# Patient Record
Sex: Female | Born: 1975 | Race: Black or African American | Hispanic: No | Marital: Married | Smoking: Never smoker
Health system: Southern US, Community
[De-identification: ages and names within clinical notes are randomized; demographics above are authoritative.]

## PROBLEM LIST (undated history)

## (undated) DIAGNOSIS — M7070 Other bursitis of hip, unspecified hip: Secondary | ICD-10-CM

## (undated) DIAGNOSIS — H3552 Pigmentary retinal dystrophy: Secondary | ICD-10-CM

## (undated) DIAGNOSIS — H548 Legal blindness, as defined in USA: Secondary | ICD-10-CM

---

## 2002-12-14 ENCOUNTER — Encounter: Payer: Self-pay | Admitting: Obstetrics and Gynecology

## 2002-12-14 ENCOUNTER — Ambulatory Visit (HOSPITAL_COMMUNITY): Admission: RE | Admit: 2002-12-14 | Discharge: 2002-12-14 | Payer: Self-pay | Admitting: Obstetrics and Gynecology

## 2003-01-19 ENCOUNTER — Encounter: Payer: Self-pay | Admitting: Emergency Medicine

## 2003-01-19 ENCOUNTER — Emergency Department (HOSPITAL_COMMUNITY): Admission: EM | Admit: 2003-01-19 | Discharge: 2003-01-20 | Payer: Self-pay

## 2003-04-29 ENCOUNTER — Inpatient Hospital Stay (HOSPITAL_COMMUNITY): Admission: AD | Admit: 2003-04-29 | Discharge: 2003-04-29 | Payer: Self-pay | Admitting: Obstetrics & Gynecology

## 2003-04-30 ENCOUNTER — Inpatient Hospital Stay (HOSPITAL_COMMUNITY): Admission: AD | Admit: 2003-04-30 | Discharge: 2003-05-02 | Payer: Self-pay | Admitting: Obstetrics & Gynecology

## 2003-06-01 ENCOUNTER — Other Ambulatory Visit: Admission: RE | Admit: 2003-06-01 | Discharge: 2003-06-01 | Payer: Self-pay | Admitting: Obstetrics & Gynecology

## 2003-06-17 ENCOUNTER — Ambulatory Visit (HOSPITAL_COMMUNITY): Admission: RE | Admit: 2003-06-17 | Discharge: 2003-06-17 | Payer: Self-pay | Admitting: Family Medicine

## 2006-05-03 ENCOUNTER — Ambulatory Visit (HOSPITAL_COMMUNITY): Admission: RE | Admit: 2006-05-03 | Discharge: 2006-05-03 | Payer: Self-pay | Admitting: Family Medicine

## 2006-05-03 ENCOUNTER — Emergency Department (HOSPITAL_COMMUNITY): Admission: EM | Admit: 2006-05-03 | Discharge: 2006-05-03 | Payer: Self-pay | Admitting: Emergency Medicine

## 2007-09-05 IMAGING — CT CT PELVIS W/ CM
2 of 5 series · 17 of 46 positions shown, 19 images · IV contrast ([ID]/WATER)
Comparison: none

CLINICAL DATA: Nausea and vomiting.  Abdominal and back pain. History of renal calculi.
ABDOMEN CT WITH CONTRAST:
TECHNIQUE: Multidetector CT imaging of the abdomen was performed following the standard protocol during bolus administration of intravenous contrast. 
Contrast:  100 cc Omnipaque 300 and oral contrast.
TECHNIQUE: Multidetector CT imaging of the pelvis was performed following the standard protocol during bolus administration of intravenous contrast.

[Series 2: routine abdomen · axial · 0.93mm/px · z∈[-435,-10]mm · 14 of 95 slices shown, 16 images]
[im 5/95  soft-tissue]
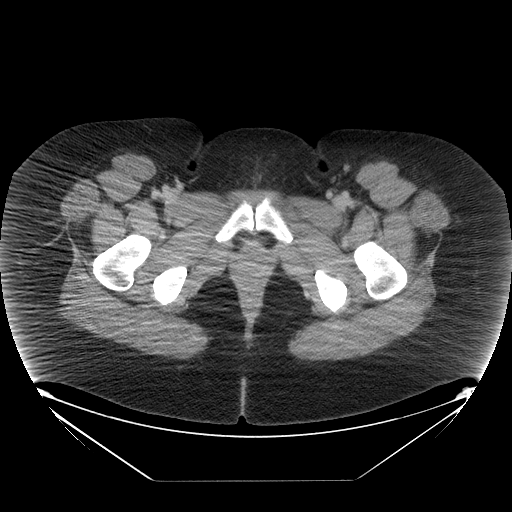
[im 5/95  bone]
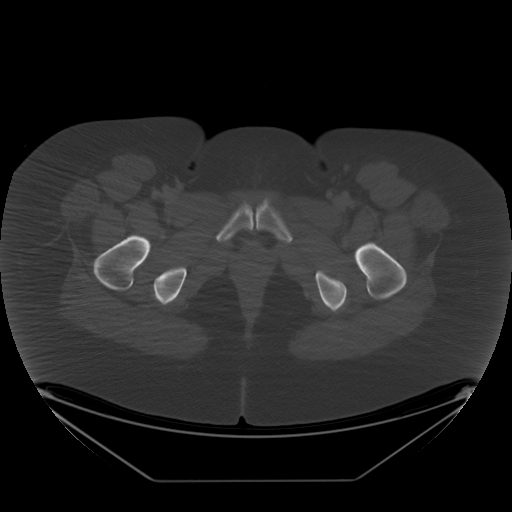
[im 15/95  soft-tissue]
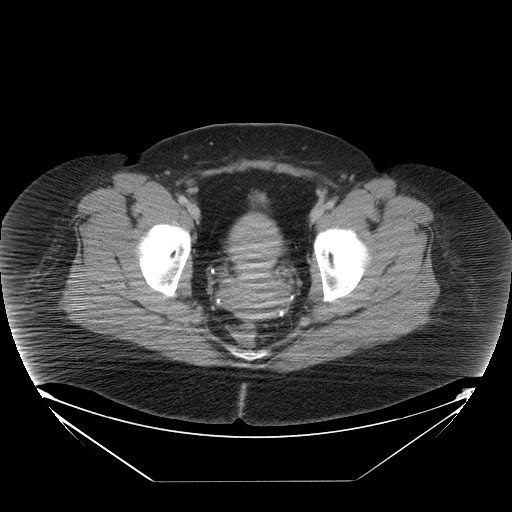
[im 19/95  soft-tissue]
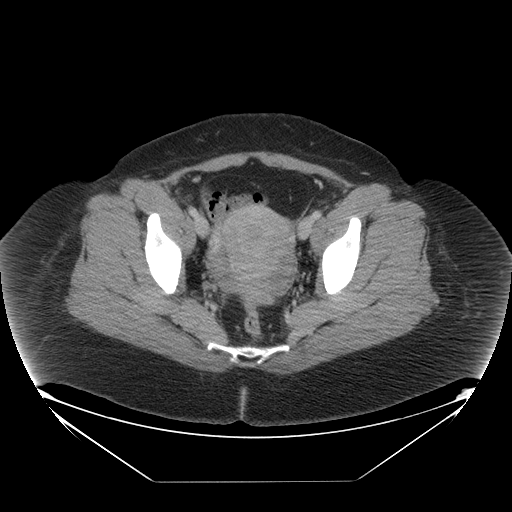
[im 24/95  soft-tissue]
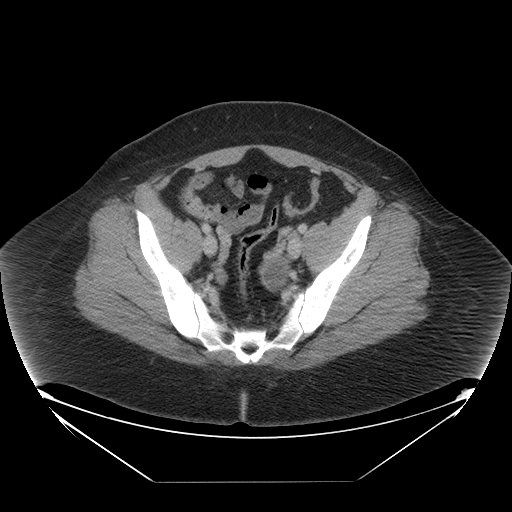
[im 33/95  soft-tissue]
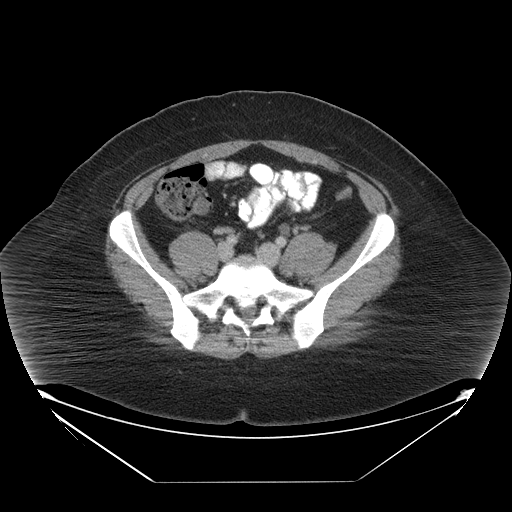
[im 38/95  soft-tissue]
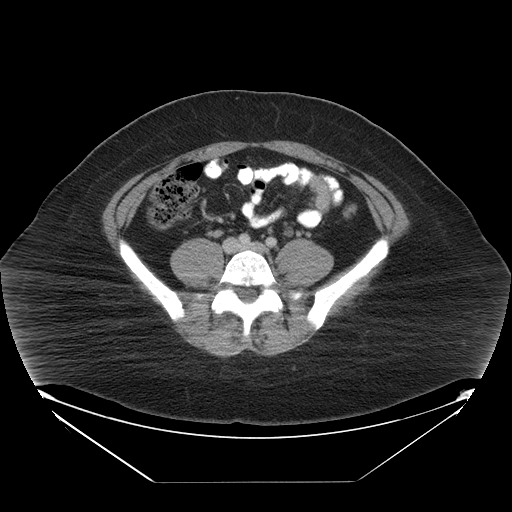
[im 43/95  soft-tissue]
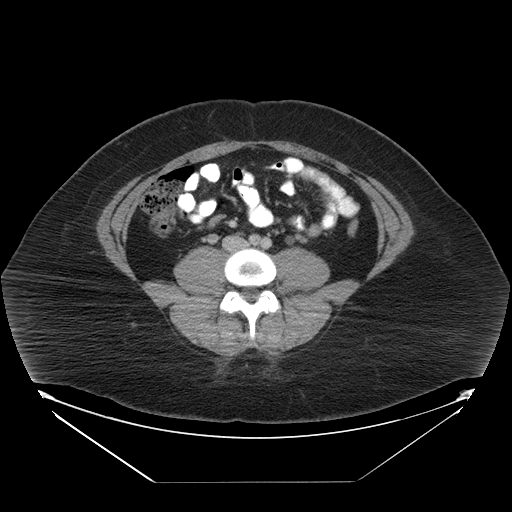
[im 52/95  soft-tissue]
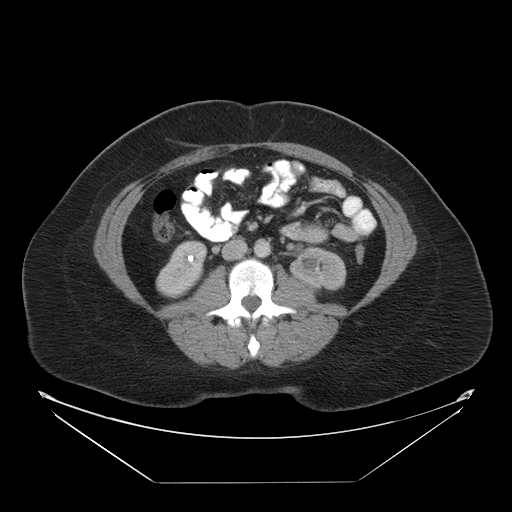
[im 57/95  soft-tissue]
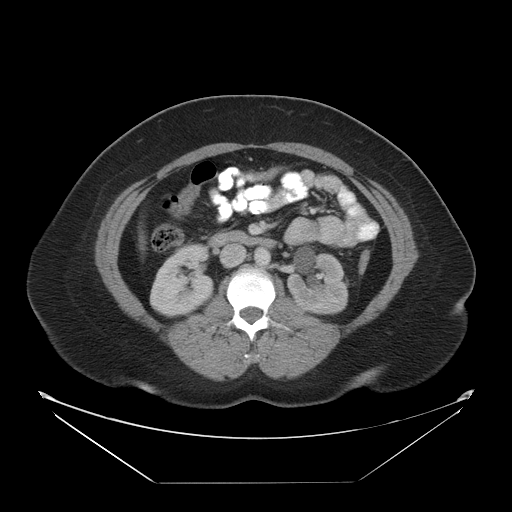
[im 57/95  bone]
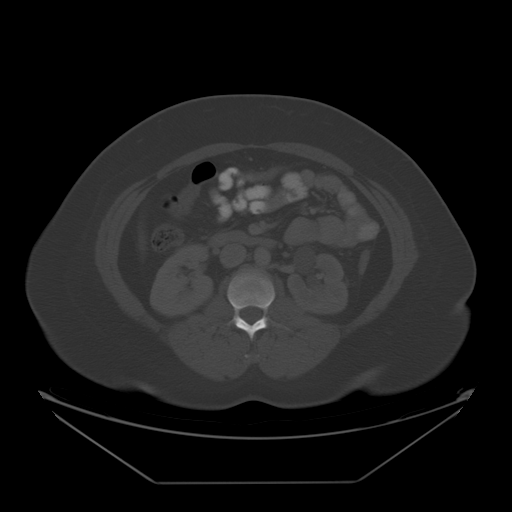
[im 62/95  soft-tissue]
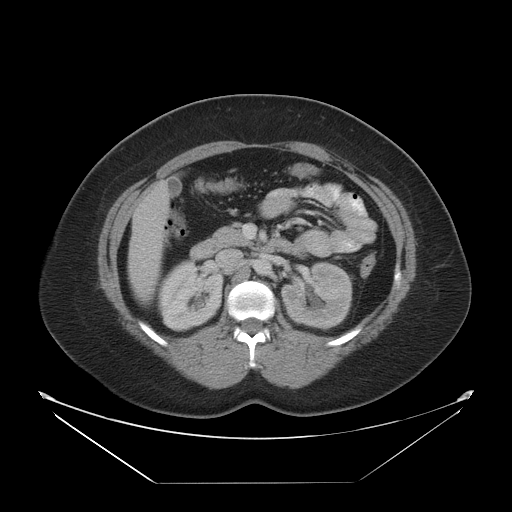
[im 71/95  soft-tissue]
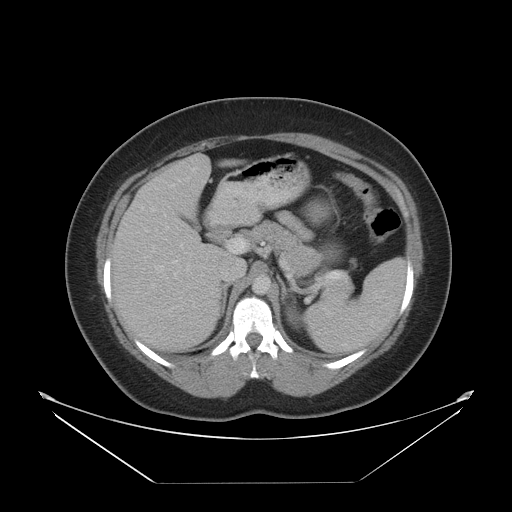
[im 76/95  soft-tissue]
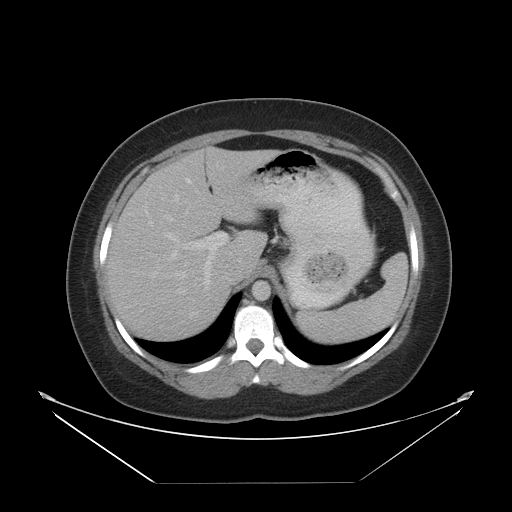
[im 80/95  soft-tissue]
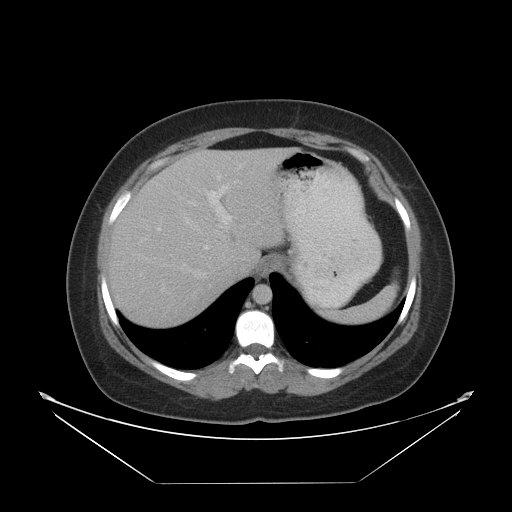
[im 90/95  soft-tissue]
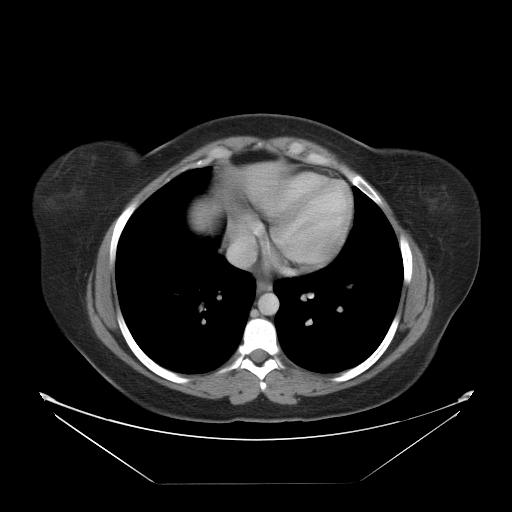

[Series 400: reformatted · coronal · 0.99mm/px · 3 of 143 slices shown]
[im 48/143  soft-tissue]
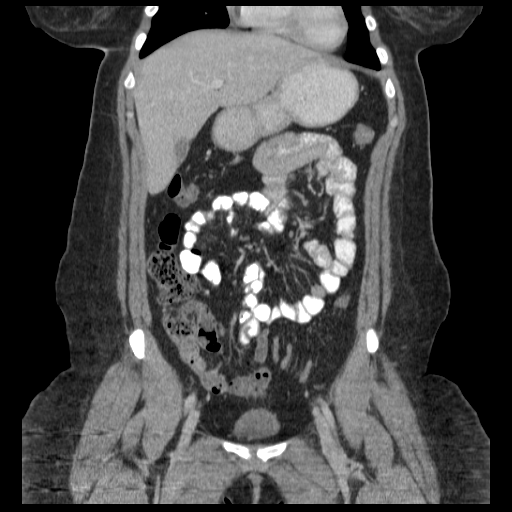
[im 64/143  soft-tissue]
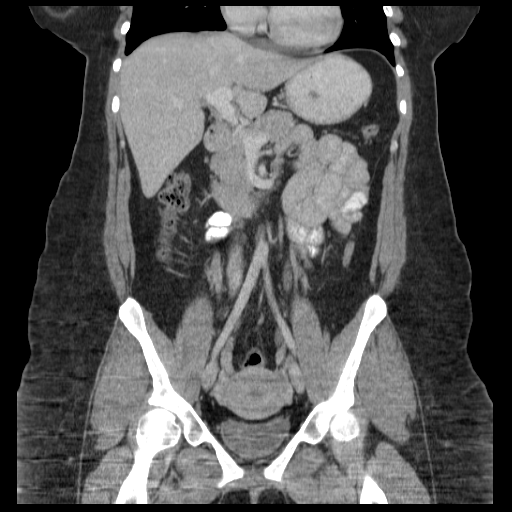
[im 79/143  soft-tissue]
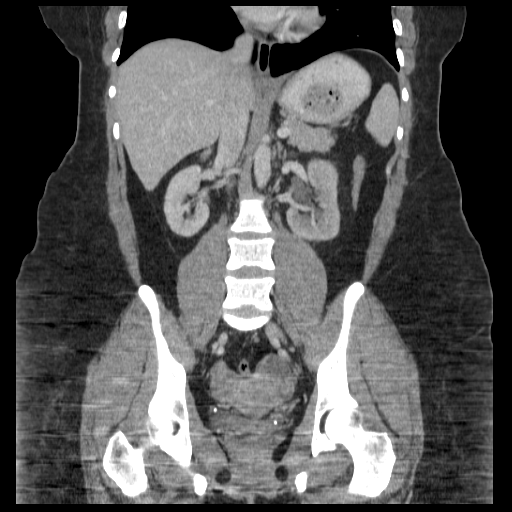

[17 of 46 positions shown; findings below may reference images not displayed]

FINDINGS: A calculus is seen in the lower pole of the right kidney which measures 5 mm. There is no evidence of right-sided hydronephrosis.  No left intrarenal calculi are seen however, there is mild left hydronephrosis and ureterectasis.  No renal masses are seen.
The liver, gallbladder, spleen, pancreas, and adrenal glands are normal in appearance.  There is no evidence of an abdominal mass or inflammatory process.  No abnormal fluid collections are seen.
IMPRESSION: 1.  Mild left hydronephrosis.  See pelvis CT report below.
2.  5 mm nonobstructing calculus in the lower pole of the right kidney.
PELVIS CT WITH CONTRAST:
FINDINGS: A calculus is seen at the left ureterovesical junction which measures approximately 3 mm.  Mild dilatation of the left ureter is seen.  
There is no evidence of a pelvic mass or adenopathy.  There is no evidence of an inflammatory process or abnormal fluid collections.  The other pelvic structures are unremarkable.
IMPRESSION: 3 mm calculus at the left ureterovesical junction.  This results in mild left ureteral dilatation and hydronephrosis.

## 2013-09-16 ENCOUNTER — Telehealth: Payer: Self-pay | Admitting: Emergency Medicine

## 2013-09-16 NOTE — Telephone Encounter (Signed)
Haley Bass REFERRED PT FOR POSSIBLE [place] CONSULT- [phone] X [phone]  FROM DR HELEN MALONE  Haley THEN CALLED BACK AND LMOVM TO CANCEL THE CONSULT THAT WAS SCHEDULED FOR 09-21-13 AT 100PM

## 2017-03-15 ENCOUNTER — Other Ambulatory Visit: Payer: Self-pay

## 2017-03-15 ENCOUNTER — Encounter (HOSPITAL_BASED_OUTPATIENT_CLINIC_OR_DEPARTMENT_OTHER): Payer: Self-pay | Admitting: Emergency Medicine

## 2017-03-15 ENCOUNTER — Emergency Department (HOSPITAL_BASED_OUTPATIENT_CLINIC_OR_DEPARTMENT_OTHER)
Admission: EM | Admit: 2017-03-15 | Discharge: 2017-03-15 | Disposition: A | Discharge status: Home / Self Care | Payer: Non-veteran care | Attending: Emergency Medicine | Admitting: Emergency Medicine

## 2017-03-15 ENCOUNTER — Emergency Department (HOSPITAL_BASED_OUTPATIENT_CLINIC_OR_DEPARTMENT_OTHER): Payer: Non-veteran care

## 2017-03-15 DIAGNOSIS — R0789 Other chest pain: Secondary | ICD-10-CM | POA: Insufficient documentation

## 2017-03-15 DIAGNOSIS — R079 Chest pain, unspecified: Secondary | ICD-10-CM | POA: Diagnosis present

## 2017-03-15 HISTORY — DX: Pigmentary retinal dystrophy: H35.52

## 2017-03-15 HISTORY — DX: Legal blindness, as defined in USA: H54.8

## 2017-03-15 HISTORY — DX: Other bursitis of hip, unspecified hip: M70.70

## 2017-03-15 LAB — CBC WITH DIFFERENTIAL/PLATELET
BASOS PCT: 1 %
Basophils Absolute: 0 10*3/uL (ref 0.0–0.1)
EOS ABS: 0.3 10*3/uL (ref 0.0–0.7)
EOS PCT: 3 %
HCT: 37 % (ref 36.0–46.0)
HEMOGLOBIN: 12.3 g/dL (ref 12.0–15.0)
Lymphocytes Relative: 31 %
Lymphs Abs: 2.7 10*3/uL (ref 0.7–4.0)
MCH: 27.4 pg (ref 26.0–34.0)
MCHC: 33.2 g/dL (ref 30.0–36.0)
MCV: 82.4 fL (ref 78.0–100.0)
MONOS PCT: 9 %
Monocytes Absolute: 0.8 10*3/uL (ref 0.1–1.0)
NEUTROS PCT: 56 %
Neutro Abs: 4.8 10*3/uL (ref 1.7–7.7)
PLATELETS: 337 10*3/uL (ref 150–400)
RBC: 4.49 MIL/uL (ref 3.87–5.11)
RDW: 13.5 % (ref 11.5–15.5)
WBC: 8.6 10*3/uL (ref 4.0–10.5)

## 2017-03-15 LAB — BASIC METABOLIC PANEL
Anion gap: 6 (ref 5–15)
BUN: 13 mg/dL (ref 6–20)
CHLORIDE: 109 mmol/L (ref 101–111)
CO2: 23 mmol/L (ref 22–32)
CREATININE: 0.73 mg/dL (ref 0.44–1.00)
Calcium: 8.7 mg/dL — ABNORMAL LOW (ref 8.9–10.3)
GFR calc non Af Amer: >60 mL/min (ref >60)
Glucose, Bld: 96 mg/dL (ref 65–99)
Potassium: 3.7 mmol/L (ref 3.5–5.1)
SODIUM: 138 mmol/L (ref 135–145)

## 2017-03-15 LAB — TROPONIN I: Troponin I: <0.03 ng/mL (ref <0.03)

## 2017-03-15 MED ORDER — METHOCARBAMOL 500 MG PO TABS
1000.0000 mg | ORAL_TABLET | Freq: Once | ORAL | Status: AC
  Administered 2017-03-15: 1000 mg via ORAL
  Filled 2017-03-15: qty 2

## 2017-03-15 MED ORDER — ACETAMINOPHEN 500 MG PO TABS
1000.0000 mg | ORAL_TABLET | Freq: Once | ORAL | Status: AC
  Administered 2017-03-15: 1000 mg via ORAL
  Filled 2017-03-15: qty 2

## 2017-03-15 MED ORDER — NAPROXEN 500 MG PO TABS: 500.0000 mg | ORAL_TABLET | Freq: Two times a day (BID) | ORAL | 0 refills | Status: AC

## 2017-03-15 MED ORDER — METAXALONE 800 MG PO TABS: 800.0000 mg | ORAL_TABLET | Freq: Three times a day (TID) | ORAL | 0 refills | Status: AC

## 2017-03-15 NOTE — ED Notes (Signed)
Back from xray, EKG in progress, EDP into room. Alert, NAD, calm, interactive, resps e/u, speaking in clear complete sentences, no dyspnea noted, skin W&D, VSS, (denies: sob, NV, palpitations/ fluttering, numbness/ tingling, dizziness, or diaphoresis). [facility]

## 2017-03-15 NOTE — ED Provider Notes (Signed)
[facility] EMERGENCY DEPARTMENT Provider Note   CSN: [id number] Arrival date & time: 03/15/17  0316     History   Chief Complaint Chief Complaint  Patient presents with  . Chest Pain    HPI Haley Bass is a 41 y.o. female.  The history is provided by the patient.  Chest Pain   This is a new problem. The current episode started 2 days ago. The problem occurs constantly. The problem has not changed since onset.The pain is associated with raising an arm. The pain is present in the substernal region. The pain is moderate. The pain does not radiate. The symptoms are aggravated by certain positions. Pertinent negatives include no abdominal pain, no back pain, no claudication, no cough, no diaphoresis, no fever, no hemoptysis, no irregular heartbeat, no leg pain, no orthopnea, no palpitations, no PND, no shortness of breath, no sputum production, no syncope and no vomiting. Treatments tried: nsaids. Risk factors include obesity.  Pertinent negatives for past medical history include no Marfan's syndrome.  Pertinent negatives for family medical history include: no Marfan's syndrome.  Procedure history is negative for cardiac catheterization.  Rolled over in bed and heard a crack and it initially felt better with alleve and then she rolled over again and it was worse.  No DOE no SOb no n/v/d.  No travel no leg pain or swelling.    Past Medical History:  Diagnosis Date  . Bursitis of hip   . Legally blind   . Retinitis pigmentosa     There are no active problems to display for this patient.   History reviewed. No pertinent surgical history.  OB History    No data available       Home Medications    Prior to Admission medications   Not on File    Family History History reviewed. No pertinent family history.  Social History Social History   Tobacco Use  . Smoking status: Never Smoker  . Smokeless tobacco: Never Used  Substance Use Topics  . Alcohol use:  No    Frequency: Never  . Drug use: No     Allergies   Patient has no known allergies.   Review of Systems Review of Systems  Constitutional: Negative for diaphoresis and fever.  Respiratory: Negative for cough, hemoptysis, sputum production and shortness of breath.   Cardiovascular: Positive for chest pain. Negative for palpitations, orthopnea, claudication, leg swelling, syncope and PND.  Gastrointestinal: Negative for abdominal pain and vomiting.  Musculoskeletal: Negative for back pain and neck pain.  All other systems reviewed and are negative.    Physical Exam Updated Vital Signs BP 126/85 (BP Location: Right Arm)   Pulse 79   Temp 98.3 F (36.8 C) (Oral)   Resp 16   Ht 5\' 6"  (1.676 m)   Wt 128.4 kg (283 lb)   SpO2 100%   BMI 45.68 kg/m   Physical Exam  Constitutional: She is oriented to person, place, and time. She appears well-developed and well-nourished.  HENT:  Head: Normocephalic and atraumatic.  Mouth/Throat: No oropharyngeal exudate.  Eyes: Conjunctivae are normal. Pupils are equal, round, and reactive to light.  Neck: Normal range of motion. Neck supple.  Cardiovascular: Normal rate, regular rhythm, normal heart sounds and intact distal pulses.  Pulmonary/Chest: Effort normal and breath sounds normal. No stridor. She has no wheezes. She has no rales.  Abdominal: Soft. Bowel sounds are normal. She exhibits no mass. There is no tenderness. There is no rebound  and no guarding.  Musculoskeletal: Normal range of motion. She exhibits no tenderness.  Neurological: She is alert and oriented to person, place, and time. She displays normal reflexes.  Skin: Skin is warm and dry. Capillary refill takes less than 2 seconds. She is not diaphoretic.  Psychiatric: She has a normal mood and affect.     ED Treatments / Results  Labs (all labs ordered are listed, but only abnormal results are displayed)  Results for orders placed or performed during the hospital  encounter of 03/15/17  CBC with Differential/Platelet  Result Value Ref Range   WBC 8.6 4.0 - 10.5 K/uL   RBC 4.49 3.87 - 5.11 MIL/uL   Hemoglobin 12.3 12.0 - 15.0 g/dL   HCT 16.137.0 09.636.0 - 04.546.0 %   MCV 82.4 78.0 - 100.0 fL   MCH 27.4 26.0 - 34.0 pg   MCHC 33.2 30.0 - 36.0 g/dL   RDW 40.913.5 81.111.5 - 91.415.5 %   Platelets 337 150 - 400 K/uL   Neutrophils Relative % 56 %   Neutro Abs 4.8 1.7 - 7.7 K/uL   Lymphocytes Relative 31 %   Lymphs Abs 2.7 0.7 - 4.0 K/uL   Monocytes Relative 9 %   Monocytes Absolute 0.8 0.1 - 1.0 K/uL   Eosinophils Relative 3 %   Eosinophils Absolute 0.3 0.0 - 0.7 K/uL   Basophils Relative 1 %   Basophils Absolute 0.0 0.0 - 0.1 K/uL  Basic metabolic panel  Result Value Ref Range   Sodium 138 135 - 145 mmol/L   Potassium 3.7 3.5 - 5.1 mmol/L   Chloride 109 101 - 111 mmol/L   CO2 23 22 - 32 mmol/L   Glucose, Bld 96 65 - 99 mg/dL   BUN 13 6 - 20 mg/dL   Creatinine, Ser 7.820.73 0.44 - 1.00 mg/dL   Calcium 8.7 (L) 8.9 - 10.3 mg/dL   GFR calc non Af Amer >60 >60 mL/min   GFR calc Af Amer >60 >60 mL/min   Anion gap 6 5 - 15  Troponin I  Result Value Ref Range   Troponin I <0.03 <0.03 ng/mL   Dg Chest 2 View  Result Date: 03/15/2017 CLINICAL DATA:  Shortness of breath and chest pain. EXAM: CHEST  2 VIEW COMPARISON:  None. FINDINGS: The heart size and mediastinal contours are within normal limits. Both lungs are clear. The visualized skeletal structures are unremarkable. IMPRESSION: No active cardiopulmonary disease. Electronically Signed   By: Deatra RobinsonKevin  Herman M.D.   On: 03/15/2017 04:00    EKG  EKG Interpretation  Date/Time:  Saturday March 15 2017 03:42:17 EST Ventricular Rate:  78 PR Interval:    QRS Duration: 89 QT Interval:  373 QTC Calculation: 425 R Axis:   50 Text Interpretation:  Sinus rhythm Baseline wander in lead(s) II III aVR aVF V6 Confirmed by Nicanor AlconPalumbo, Leahmarie Gasiorowski ([id number]) on 03/15/2017 3:46:23 AM       Radiology Dg Chest 2 View  Result Date:  03/15/2017 CLINICAL DATA:  Shortness of breath and chest pain. EXAM: CHEST  2 VIEW COMPARISON:  None. FINDINGS: The heart size and mediastinal contours are within normal limits. Both lungs are clear. The visualized skeletal structures are unremarkable. IMPRESSION: No active cardiopulmonary disease. Electronically Signed   By: Deatra RobinsonKevin  Herman M.D.   On: 03/15/2017 04:00    Procedures Procedures (including critical care time)  Medications Ordered in ED Medications  acetaminophen (TYLENOL) tablet 1,000 mg (1,000 mg Oral Given 03/15/17 0404)  methocarbamol (ROBAXIN) tablet  1,000 mg (1,000 mg Oral Given 03/15/17 0403)       Final Clinical Impressions(s) / ED Diagnoses   All questions answered to the patient's satisfaction. Pain is MSK in nature.  HEART score is 1 very low risk for MACE.  PERC negative wells 0 highly doubt PE in this very low risk patient.    Strict return precautions for fever, inability to open the mouth, inability to speech, swelling behind the ear or in the neck, fevers,  global weakness, vomiting, swelling or the lips or tongue, chest pain, dyspnea on exertion,shortness of breath, persistent pain, Inability to tolerate liquids or food, cough, altered mental status or any concerns. No signs of systemic illness or infection. The patient is nontoxic-appearing on exam and vital signs are within normal limits.    I have reviewed the triage vital signs and the nursing notes. Pertinent labs &imaging results that were available during my care of the patient were reviewed by me and considered in my medical decision making (see chart for details).  After history, exam, and medical workup I feel the patient has been appropriately medically screened and is safe for discharge home. Pertinent diagnoses were discussed with the patient. Patient was given return precautions   Mohanad Carsten, MD 03/15/17 [phone]

## 2017-03-15 NOTE — ED Notes (Signed)
EDP in to update pt/family.

## 2017-03-15 NOTE — ED Notes (Signed)
EDP into explain/ discuss results and d/c plan.

## 2017-03-15 NOTE — ED Triage Notes (Signed)
(  Legally blind) here with family, here for upper sternal "cracking and pain". Noticed Thursday when she rolled over in bed. Noticed again last night around 1900. Denies sx other than pain. Pain worse with movement. Some relief with aleve at 1500, but did not last. Woke at 0100 with worse pain. Took advil at 0130. Rates 10/10. [facility] [place]

## 2018-07-18 IMAGING — DX DG CHEST 2V
2 series · 2 of 2 positions shown · non-contrast
Comparison: None.

CLINICAL DATA: Shortness of breath and chest pain.

EXAM:
CHEST  2 VIEW

[chest pa]
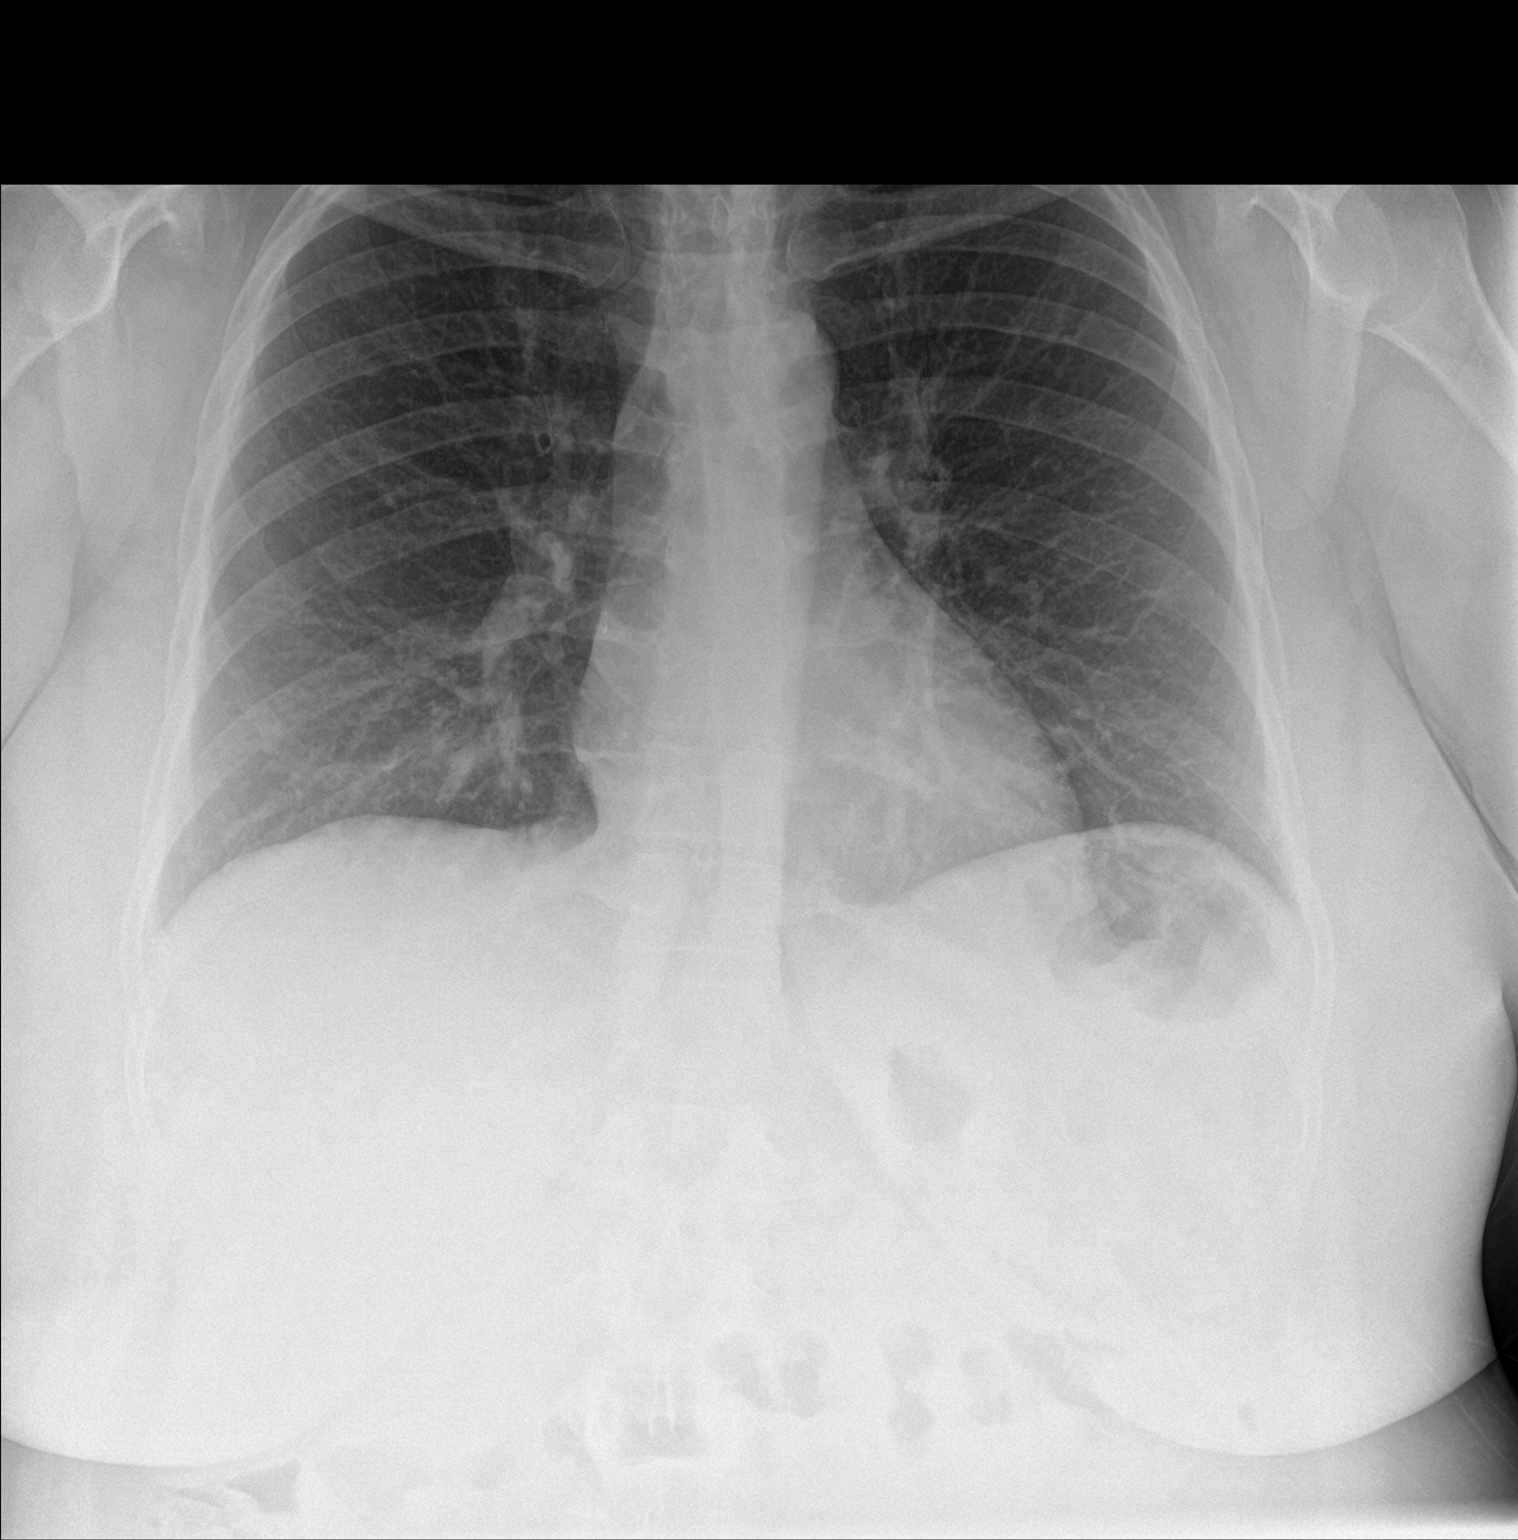

[chest lat]
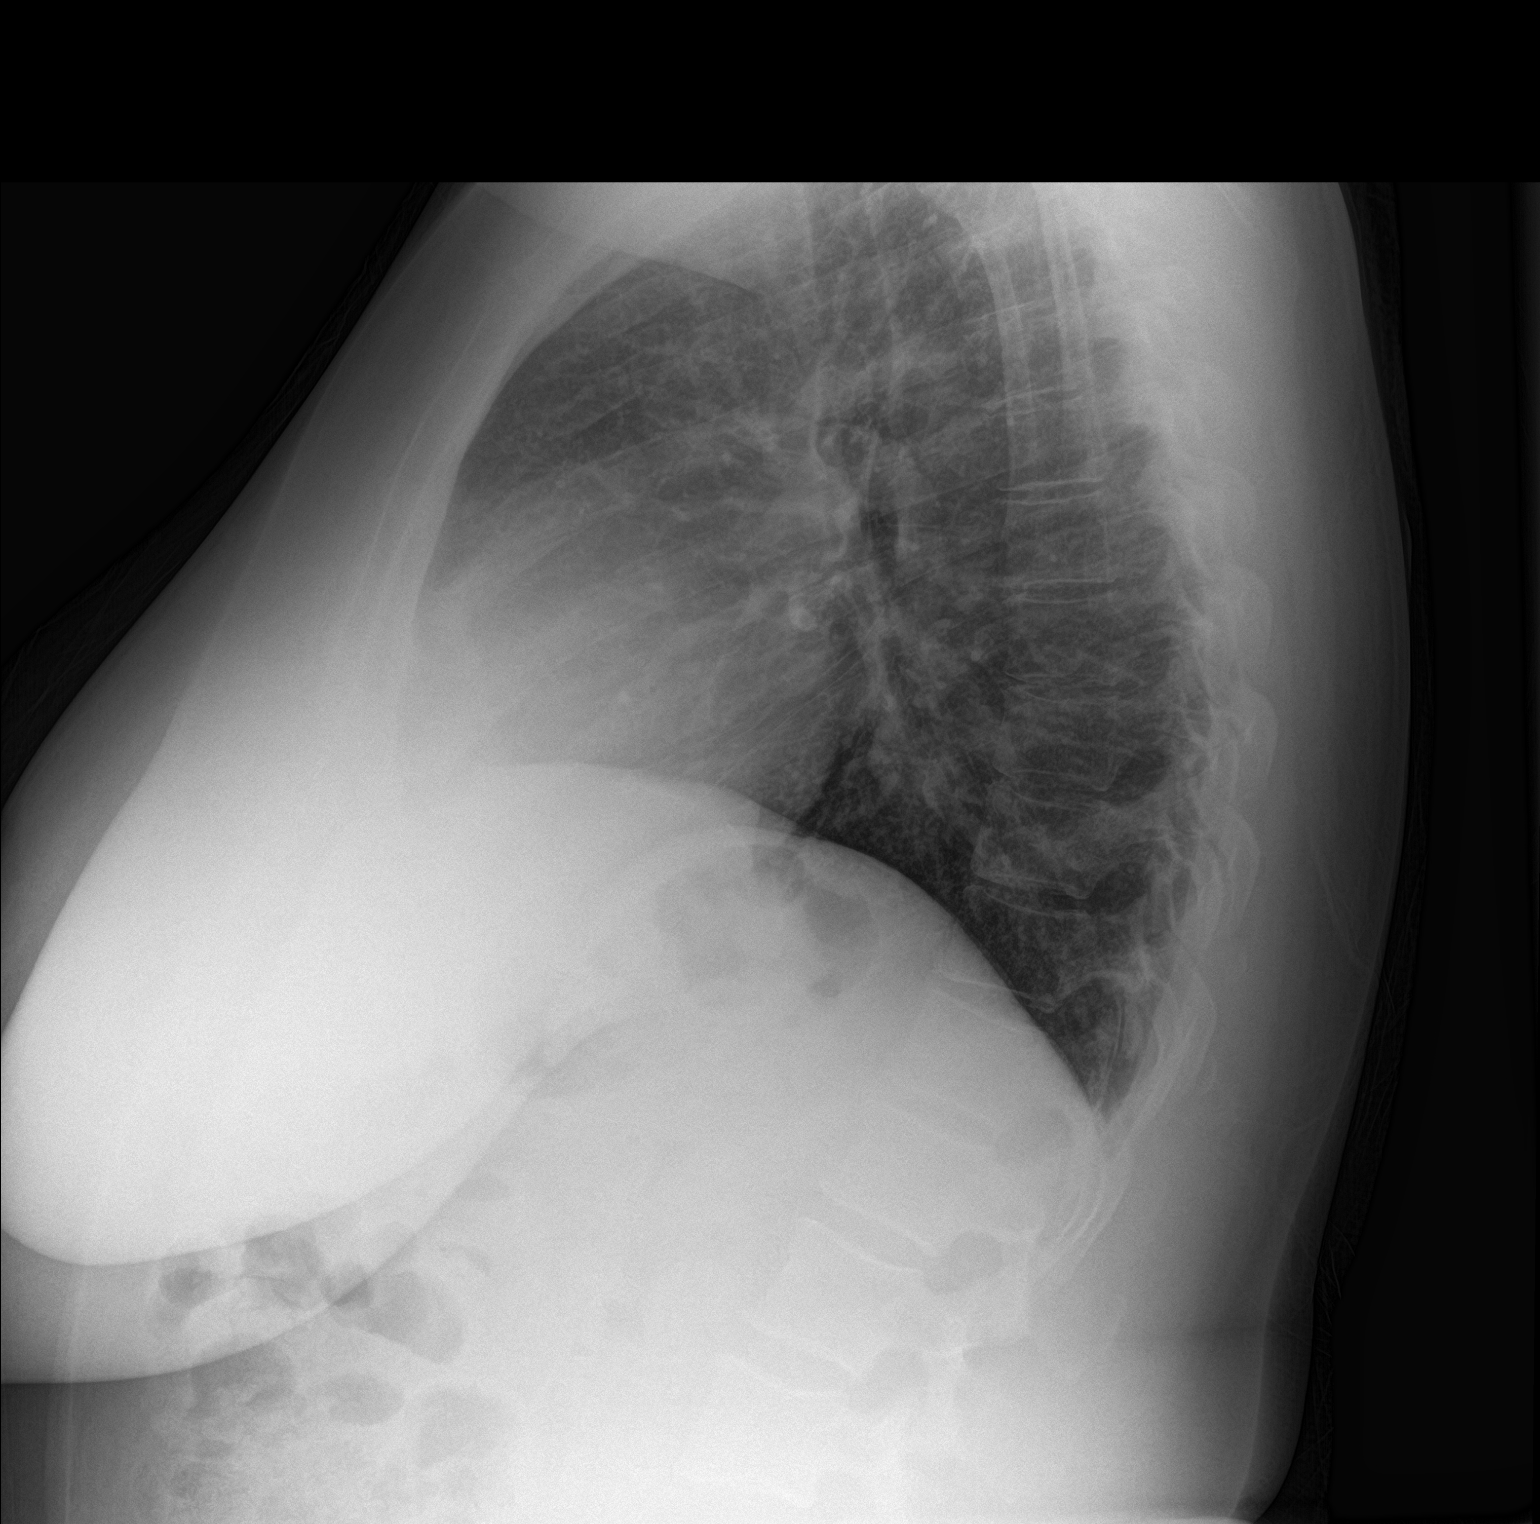

[2 of 2 positions shown; findings below may reference images not displayed]

FINDINGS: The heart size and mediastinal contours are within normal limits.
Both lungs are clear. The visualized skeletal structures are
unremarkable.
IMPRESSION: No active cardiopulmonary disease.
